# Patient Record
Sex: Male | Born: 1975 | Race: Black or African American | Hispanic: No | Marital: Married | Smoking: Never smoker
Health system: Southern US, Community
[De-identification: ages and names within clinical notes are randomized; demographics above are authoritative.]

## PROBLEM LIST (undated history)

## (undated) DIAGNOSIS — I10 Essential (primary) hypertension: Secondary | ICD-10-CM

---

## 2007-02-02 ENCOUNTER — Emergency Department: Payer: Self-pay | Admitting: Emergency Medicine

## 2008-09-21 IMAGING — CT CT ABD-PELV W/ CM
1 of 2 series · 15 of 32 positions shown, 19 images · non-contrast
Comparison: none

REASON FOR EXAM: (1) appy, hydronephrosis; (2) BLQ pain (+Mcberny's point
tend) , L CVAT, high fe
COMMENTS:

PROCEDURE:     CT  - CT ABDOMEN / PELVIS  W  - February 02, 2007 [DATE]
RESULT:     Comparison: No available comparison exam.
TECHNIQUE: CT examination of the abdomen and pelvis was performed after
intravenous administration of 100 cc of Xsovue-PWA nonionic contrast in
addition to oral contrast. Collimation is 3 mm.

[Series 2: appendicitis · axial · 0.72mm/px · z∈[-368,+34]mm · 15 of 146 slices shown, 19 images]
[im 6/146  soft-tissue]
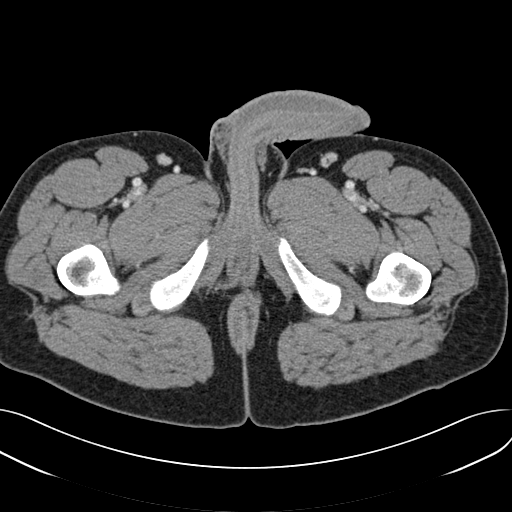
[im 6/146  bone]
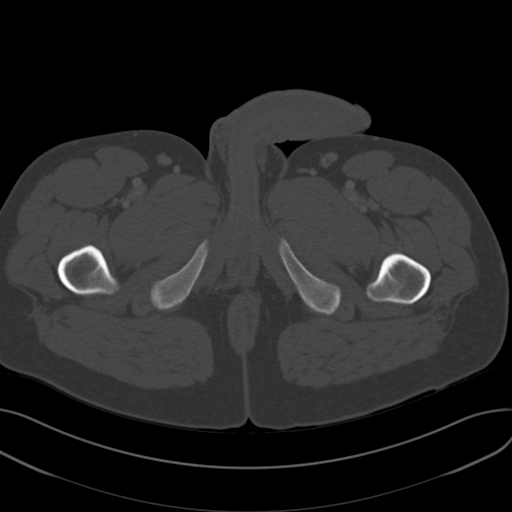
[im 17/146  soft-tissue]
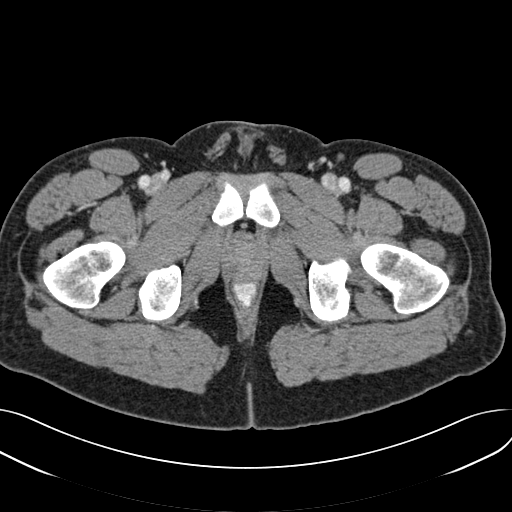
[im 28/146  soft-tissue]
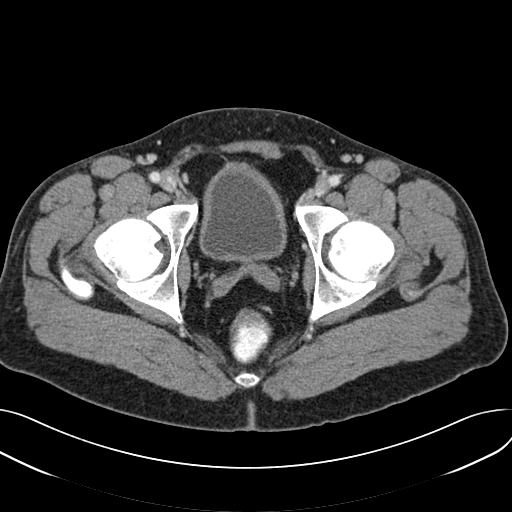
[im 40/146  soft-tissue]
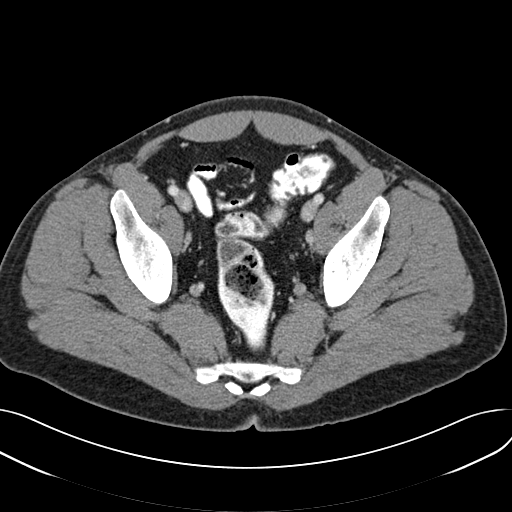
[im 51/146  soft-tissue]
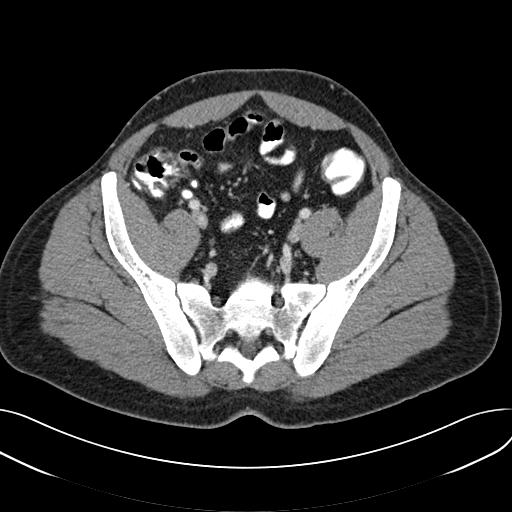
[im 62/146  soft-tissue]
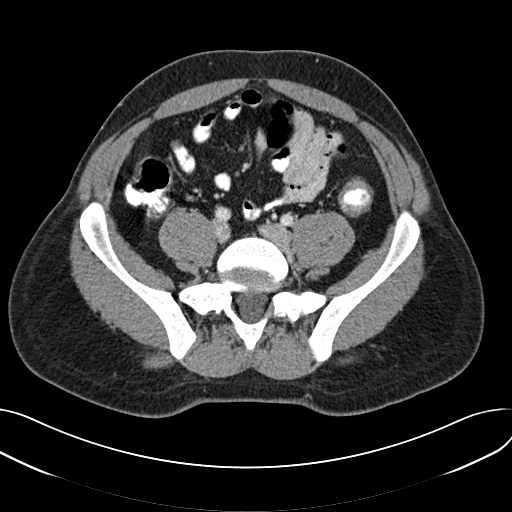
[im 73/146  soft-tissue]
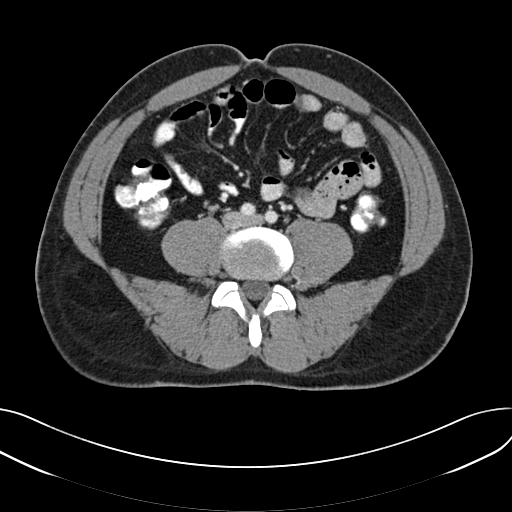
[im 84/146  soft-tissue]
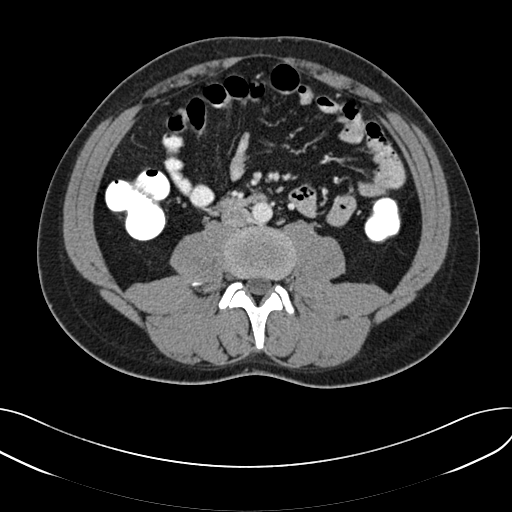
[im 95/146  soft-tissue]
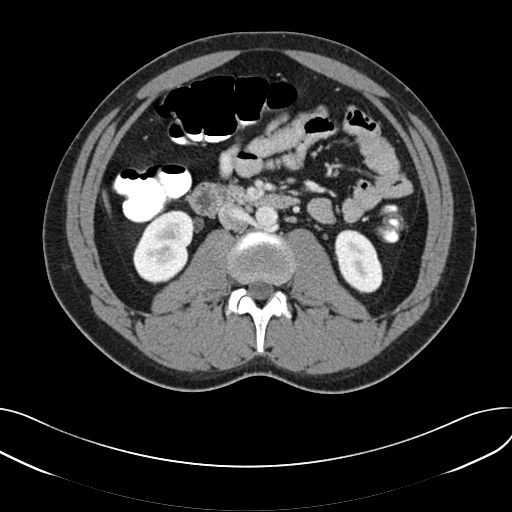
[im 95/146  bone]
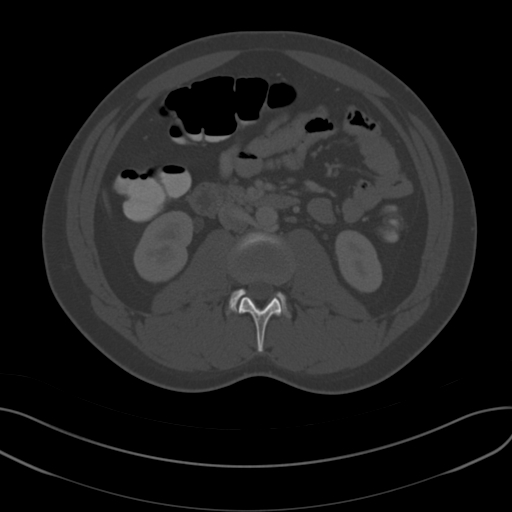
[im 106/146  soft-tissue]
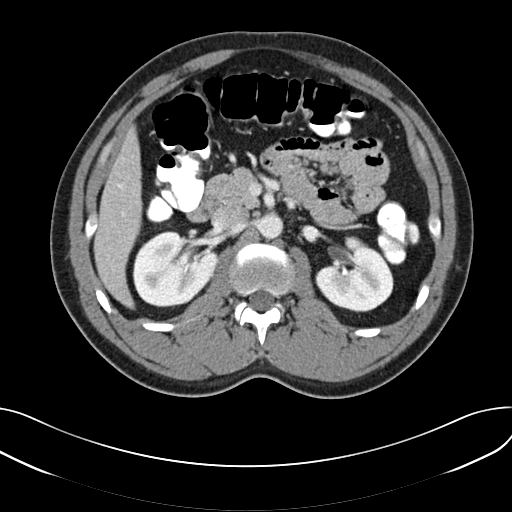
[im 118/146  soft-tissue]
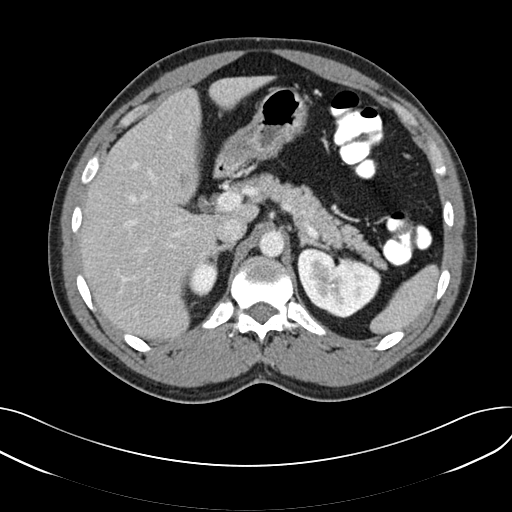
[im 123/146  lung]
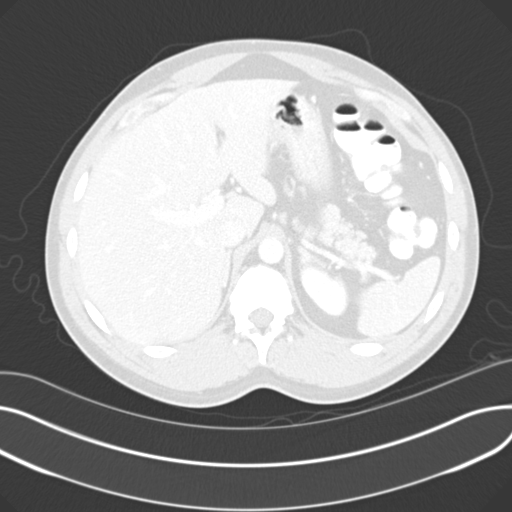
[im 129/146  soft-tissue]
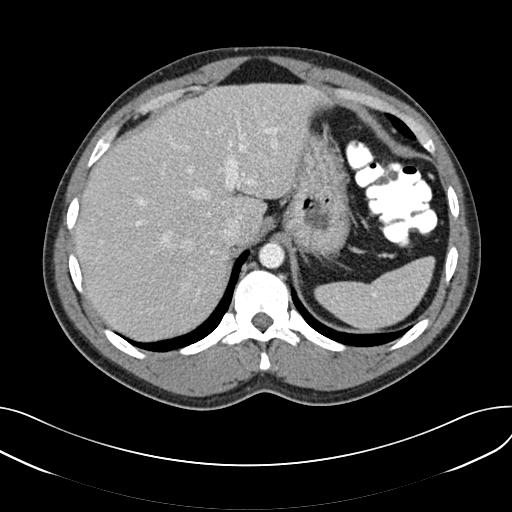
[im 129/146  lung]
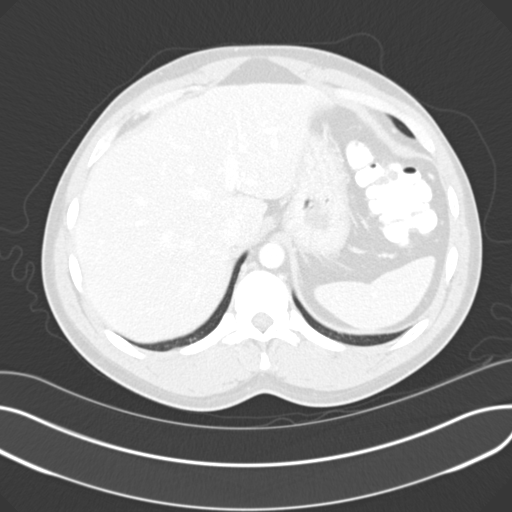
[im 134/146  lung]
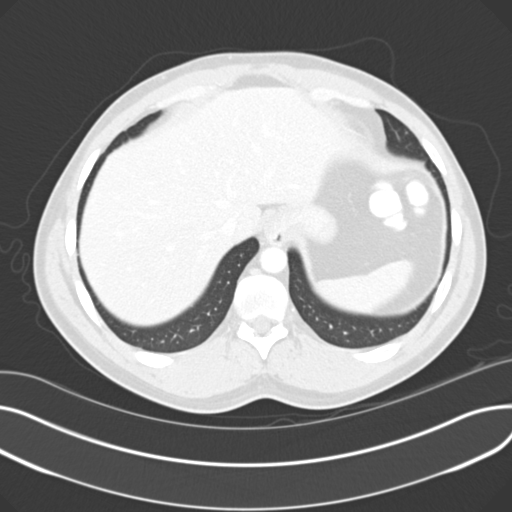
[im 140/146  soft-tissue]
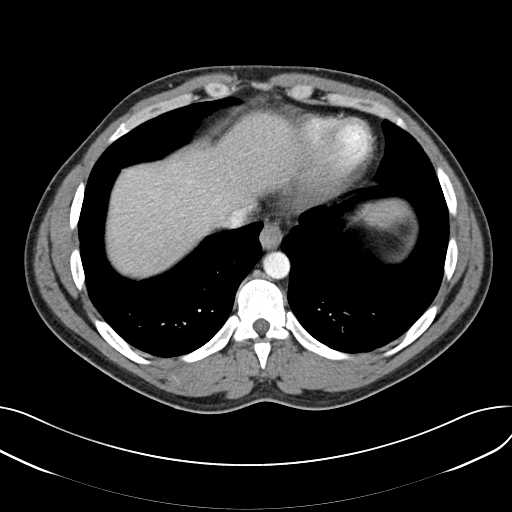
[im 140/146  lung]
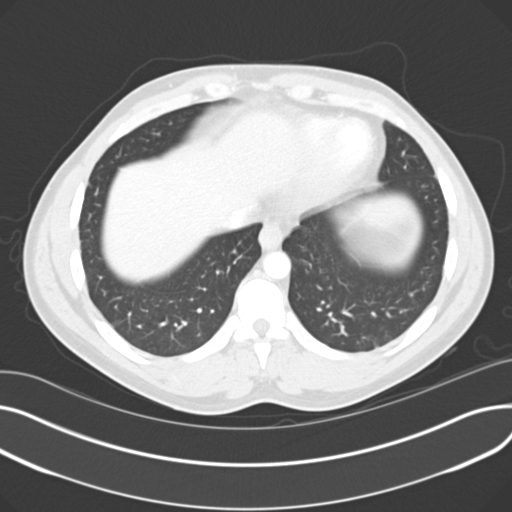

[15 of 32 positions shown; findings below may reference images not displayed]

FINDINGS: Limited evaluation of the lung bases is unremarkable

The liver, gallbladder, spleen, pancreas, adrenal glands and kidneys are
unremarkable.

There is no dilatation or definite wall thickening of the  bowels. The
appendix is unremarkable. There is no significant intra abdominal/pelvic fat
stranding. There is no intraperitoneal free air. There is no significant
free fluid. There are no enlarged abdominal pelvic lymph nodes.
IMPRESSION: 1. Small hiatal hernia.

Preliminary report was faxed to the emergency room by the night radiologist
shortly after the study was performed.

## 2019-01-23 ENCOUNTER — Other Ambulatory Visit: Payer: Self-pay

## 2019-01-23 DIAGNOSIS — Z20822 Contact with and (suspected) exposure to covid-19: Secondary | ICD-10-CM

## 2019-01-24 LAB — NOVEL CORONAVIRUS, NAA: SARS-CoV-2, NAA: NOT DETECTED

## 2019-04-10 ENCOUNTER — Other Ambulatory Visit: Payer: Self-pay

## 2019-04-10 DIAGNOSIS — Z20822 Contact with and (suspected) exposure to covid-19: Secondary | ICD-10-CM

## 2019-04-12 LAB — NOVEL CORONAVIRUS, NAA: SARS-CoV-2, NAA: NOT DETECTED

## 2019-09-04 ENCOUNTER — Ambulatory Visit: Payer: Self-pay | Attending: Internal Medicine

## 2019-09-04 ENCOUNTER — Ambulatory Visit: Payer: Self-pay

## 2019-09-04 DIAGNOSIS — Z23 Encounter for immunization: Secondary | ICD-10-CM

## 2019-09-04 NOTE — Progress Notes (Signed)
   Covid-19 Vaccination Clinic  Name:  Tyler Marshall    MRN: [id number] DOB: [birth year]  09/04/2019  Mr. Tyler Marshall was observed post Covid-19 immunization for 15 minutes without incident. He was provided with Vaccine Information Sheet and instruction to access the V-Safe system.   Mr. Tyler Marshall was instructed to call 911 with any severe reactions post vaccine: Marland Kitchen Difficulty breathing  . Swelling of face and throat  . A fast heartbeat  . A bad rash all over body  . Dizziness and weakness   Immunizations Administered    Name Date Dose VIS Date Route   Pfizer COVID-19 Vaccine 09/04/2019 10:33 AM 0.3 mL 05/14/2019 Intramuscular   Manufacturer: [employer], [employer]   Lot: [id number]   NDC: [address]-5

## 2019-09-25 ENCOUNTER — Ambulatory Visit: Payer: Self-pay | Attending: Internal Medicine

## 2019-09-25 DIAGNOSIS — Z23 Encounter for immunization: Secondary | ICD-10-CM

## 2019-09-25 NOTE — Progress Notes (Signed)
   Covid-19 Vaccination Clinic  Name:  DARYUS SOWASH    MRN: [id number] DOB: 1975-06-28  09/25/2019  Mr. Magistro was observed post Covid-19 immunization for 15 minutes without incident. He was provided with Vaccine Information Sheet and instruction to access the V-Safe system.   Mr. Bevill was instructed to call 911 with any severe reactions post vaccine: Marland Kitchen Difficulty breathing  . Swelling of face and throat  . A fast heartbeat  . A bad rash all over body  . Dizziness and weakness   Immunizations Administered    Name Date Dose VIS Date Route   Pfizer COVID-19 Vaccine 09/25/2019 11:57 AM 0.3 mL 07/28/2018 Intramuscular   Manufacturer: [employer], [employer]   Lot: [id number]   NDC: [address]-6

## 2022-05-23 ENCOUNTER — Encounter (HOSPITAL_BASED_OUTPATIENT_CLINIC_OR_DEPARTMENT_OTHER): Payer: Self-pay

## 2022-05-23 ENCOUNTER — Emergency Department (HOSPITAL_BASED_OUTPATIENT_CLINIC_OR_DEPARTMENT_OTHER): Payer: 59

## 2022-05-23 ENCOUNTER — Other Ambulatory Visit: Payer: Self-pay

## 2022-05-23 ENCOUNTER — Emergency Department (HOSPITAL_BASED_OUTPATIENT_CLINIC_OR_DEPARTMENT_OTHER)
Admission: EM | Admit: 2022-05-23 | Discharge: 2022-05-23 | Disposition: A | Discharge status: Home / Self Care | Payer: 59 | Attending: Emergency Medicine | Admitting: Emergency Medicine

## 2022-05-23 DIAGNOSIS — S0181XA Laceration without foreign body of other part of head, initial encounter: Secondary | ICD-10-CM | POA: Insufficient documentation

## 2022-05-23 DIAGNOSIS — M25552 Pain in left hip: Secondary | ICD-10-CM | POA: Diagnosis not present

## 2022-05-23 DIAGNOSIS — M542 Cervicalgia: Secondary | ICD-10-CM | POA: Diagnosis not present

## 2022-05-23 DIAGNOSIS — Y92410 Unspecified street and highway as the place of occurrence of the external cause: Secondary | ICD-10-CM | POA: Insufficient documentation

## 2022-05-23 DIAGNOSIS — M545 Low back pain, unspecified: Secondary | ICD-10-CM | POA: Diagnosis not present

## 2022-05-23 DIAGNOSIS — M50322 Other cervical disc degeneration at C5-C6 level: Secondary | ICD-10-CM | POA: Diagnosis not present

## 2022-05-23 DIAGNOSIS — Z041 Encounter for examination and observation following transport accident: Secondary | ICD-10-CM | POA: Diagnosis not present

## 2022-05-23 DIAGNOSIS — S0993XA Unspecified injury of face, initial encounter: Secondary | ICD-10-CM | POA: Diagnosis not present

## 2022-05-23 DIAGNOSIS — M79652 Pain in left thigh: Secondary | ICD-10-CM | POA: Diagnosis not present

## 2022-05-23 HISTORY — DX: Essential (primary) hypertension: I10

## 2022-05-23 MED ORDER — HYDROCODONE-ACETAMINOPHEN 5-325 MG PO TABS
1.0000 | ORAL_TABLET | Freq: Once | ORAL | Status: AC
  Administered 2022-05-23: 1 via ORAL
  Filled 2022-05-23: qty 1

## 2022-05-23 MED ORDER — METHOCARBAMOL 500 MG PO TABS: 500.0000 mg | ORAL_TABLET | Freq: Two times a day (BID) | ORAL | 0 refills | Status: AC
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
## 2022-05-23 NOTE — ED Notes (Signed)
Patient transported to CT 

## 2022-05-23 NOTE — Discharge Instructions (Addendum)
As we discussed, your workup in the ER today was reassuring for acute findings.  CT imaging and x-ray imaging did not reveal any emergent concerns.  I have given you a prescription for a muscle relaxer for you to take as prescribed as needed for management of any residual pain.  You may also take Tylenol/ibuprofen.  Please be advised that it is normal to be sore for the next 24 to 48 hours.  Additionally, given some incidental findings on your hip x-ray which I have discussed with you, I have given you a referral to sports medicine with a number to call to schedule appointment for follow-up if you continue to have discomfort in your left hip.  Return if development of any new or worsening symptoms.
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
## 2022-08-08 ENCOUNTER — Emergency Department: Payer: 59

## 2022-08-08 ENCOUNTER — Emergency Department
Admission: EM | Admit: 2022-08-08 | Discharge: 2022-08-08 | Disposition: A | Discharge status: Home / Self Care | Payer: 59 | Attending: Emergency Medicine | Admitting: Emergency Medicine

## 2022-08-08 ENCOUNTER — Other Ambulatory Visit: Payer: Self-pay

## 2022-08-08 DIAGNOSIS — I639 Cerebral infarction, unspecified: Secondary | ICD-10-CM | POA: Diagnosis not present

## 2022-08-08 DIAGNOSIS — M62838 Other muscle spasm: Secondary | ICD-10-CM | POA: Diagnosis not present

## 2022-08-08 DIAGNOSIS — F1090 Alcohol use, unspecified, uncomplicated: Secondary | ICD-10-CM | POA: Insufficient documentation

## 2022-08-08 DIAGNOSIS — R200 Anesthesia of skin: Secondary | ICD-10-CM | POA: Diagnosis not present

## 2022-08-08 DIAGNOSIS — I10 Essential (primary) hypertension: Secondary | ICD-10-CM | POA: Insufficient documentation

## 2022-08-08 DIAGNOSIS — M2578 Osteophyte, vertebrae: Secondary | ICD-10-CM | POA: Diagnosis not present

## 2022-08-08 DIAGNOSIS — M4802 Spinal stenosis, cervical region: Secondary | ICD-10-CM | POA: Diagnosis not present

## 2022-08-08 DIAGNOSIS — R29810 Facial weakness: Secondary | ICD-10-CM | POA: Diagnosis not present

## 2022-08-08 DIAGNOSIS — Z789 Other specified health status: Secondary | ICD-10-CM

## 2022-08-08 DIAGNOSIS — E876 Hypokalemia: Secondary | ICD-10-CM | POA: Diagnosis not present

## 2022-08-08 DIAGNOSIS — R252 Cramp and spasm: Secondary | ICD-10-CM | POA: Diagnosis not present

## 2022-08-08 LAB — ETHANOL: Alcohol, Ethyl (B): 173 mg/dL — ABNORMAL HIGH (ref <10)

## 2022-08-08 LAB — COMPREHENSIVE METABOLIC PANEL
ALT: 70 U/L — ABNORMAL HIGH (ref 0–44)
AST: 129 U/L — ABNORMAL HIGH (ref 15–41)
Albumin: 4.1 g/dL (ref 3.5–5.0)
Alkaline Phosphatase: 66 U/L (ref 38–126)
Anion gap: 16 — ABNORMAL HIGH (ref 5–15)
BUN: 7 mg/dL (ref 6–20)
CO2: 27 mmol/L (ref 22–32)
Calcium: 8.8 mg/dL — ABNORMAL LOW (ref 8.9–10.3)
Chloride: 95 mmol/L — ABNORMAL LOW (ref 98–111)
Creatinine, Ser: 0.72 mg/dL (ref 0.61–1.24)
GFR, Estimated: >60 mL/min (ref >60)
Glucose, Bld: 107 mg/dL — ABNORMAL HIGH (ref 70–99)
Potassium: 2.1 mmol/L — CL (ref 3.5–5.1)
Sodium: 138 mmol/L (ref 135–145)
Total Bilirubin: 0.8 mg/dL (ref 0.3–1.2)
Total Protein: 8.1 g/dL (ref 6.5–8.1)

## 2022-08-08 LAB — MAGNESIUM: Magnesium: 1.2 mg/dL — ABNORMAL LOW (ref 1.7–2.4)

## 2022-08-08 LAB — CK: Total CK: 786 U/L — ABNORMAL HIGH (ref 49–397)

## 2022-08-08 LAB — CBC
HCT: 40.2 % (ref 39.0–52.0)
Hemoglobin: 13.5 g/dL (ref 13.0–17.0)
MCH: 31.3 pg (ref 26.0–34.0)
MCHC: 33.6 g/dL (ref 30.0–36.0)
MCV: 93.3 fL (ref 80.0–100.0)
Platelets: 259 10*3/uL (ref 150–400)
RBC: 4.31 MIL/uL (ref 4.22–5.81)
RDW: 15.9 % — ABNORMAL HIGH (ref 11.5–15.5)
WBC: 7.4 10*3/uL (ref 4.0–10.5)
nRBC: 0 % (ref 0.0–0.2)

## 2022-08-08 LAB — DIFFERENTIAL
Abs Immature Granulocytes: 0.03 10*3/uL (ref 0.00–0.07)
Basophils Absolute: 0.1 10*3/uL (ref 0.0–0.1)
Basophils Relative: 1 %
Eosinophils Absolute: 0 10*3/uL (ref 0.0–0.5)
Eosinophils Relative: 0 %
Immature Granulocytes: 0 %
Lymphocytes Relative: 23 %
Lymphs Abs: 1.7 10*3/uL (ref 0.7–4.0)
Monocytes Absolute: 0.7 10*3/uL (ref 0.1–1.0)
Monocytes Relative: 9 %
Neutro Abs: 5 10*3/uL (ref 1.7–7.7)
Neutrophils Relative %: 67 %

## 2022-08-08 LAB — PROTIME-INR
INR: 1 (ref 0.8–1.2)
Prothrombin Time: 12.9 seconds (ref 11.4–15.2)

## 2022-08-08 LAB — APTT: aPTT: 26 seconds (ref 24–36)

## 2022-08-08 MED ORDER — FOLIC ACID 1 MG PO TABS: 1.0000 mg | ORAL_TABLET | Freq: Every day | ORAL | 11 refills | Status: AC

## 2022-08-08 MED ORDER — THIAMINE HCL 100 MG PO TABS: 100.0000 mg | ORAL_TABLET | Freq: Every day | ORAL | 0 refills | Status: AC

## 2022-08-08 MED ORDER — FOLIC ACID 1 MG PO TABS
1.0000 mg | ORAL_TABLET | Freq: Every day | ORAL | Status: DC
  Administered 2022-08-08: 1 mg via ORAL
  Filled 2022-08-08: qty 1

## 2022-08-08 MED ORDER — LORAZEPAM 1 MG PO TABS: 1.0000 mg | ORAL_TABLET | ORAL | Status: DC | PRN

## 2022-08-08 MED ORDER — GADOBUTROL 1 MMOL/ML IV SOLN
7.5000 mL | Freq: Once | INTRAVENOUS | Status: AC | PRN
  Administered 2022-08-08: 7.5 mL via INTRAVENOUS

## 2022-08-08 MED ORDER — ADULT MULTIVITAMIN W/MINERALS CH
1.0000 | ORAL_TABLET | Freq: Every day | ORAL | Status: DC
  Administered 2022-08-08: 1 via ORAL
  Filled 2022-08-08: qty 1

## 2022-08-08 MED ORDER — POTASSIUM CHLORIDE 10 MEQ/100ML IV SOLN
10.0000 meq | INTRAVENOUS | Status: AC
  Administered 2022-08-08 (×4): 10 meq via INTRAVENOUS
  Filled 2022-08-08 (×4): qty 100

## 2022-08-08 MED ORDER — THIAMINE MONONITRATE 100 MG PO TABS
100.0000 mg | ORAL_TABLET | Freq: Every day | ORAL | Status: DC
  Administered 2022-08-08: 100 mg via ORAL
  Filled 2022-08-08: qty 1

## 2022-08-08 MED ORDER — SODIUM CHLORIDE 0.9% FLUSH
3.0000 mL | Freq: Once | INTRAVENOUS | Status: AC
  Administered 2022-08-08: 3 mL via INTRAVENOUS

## 2022-08-08 MED ORDER — MAGNESIUM SULFATE 2 GM/50ML IV SOLN
2.0000 g | Freq: Once | INTRAVENOUS | Status: AC
  Administered 2022-08-08: 2 g via INTRAVENOUS
  Filled 2022-08-08: qty 50

## 2022-08-08 MED ORDER — SODIUM CHLORIDE 0.9 % IV BOLUS
1000.0000 mL | Freq: Once | INTRAVENOUS | Status: AC
  Administered 2022-08-08: 1000 mL via INTRAVENOUS

## 2022-08-08 MED ORDER — POTASSIUM CHLORIDE CRYS ER 20 MEQ PO TBCR
40.0000 meq | EXTENDED_RELEASE_TABLET | Freq: Once | ORAL | Status: AC
  Administered 2022-08-08: 40 meq via ORAL
  Filled 2022-08-08: qty 2

## 2022-08-08 MED ORDER — THIAMINE HCL 100 MG/ML IJ SOLN
100.0000 mg | Freq: Every day | INTRAMUSCULAR | Status: DC
  Filled 2022-08-08: qty 2

## 2022-08-08 NOTE — Discharge Instructions (Addendum)
See Resource Guide for alcohol detox programs.  See your doctor for recheck of electrolyte levels including magnesium and potassium both of which were low today and are likely the cause of your muscle spasms.  Thank you for choosing [place] for your health care today!  Please see your primary doctor this week for a follow up appointment.   Sometimes, in the early stages of certain disease courses it is difficult to detect in the emergency department evaluation -- so, it is important that you continue to monitor your symptoms and call your doctor right away or return to the emergency department if you develop any new or worsening symptoms.  Please go to the following website to schedule new (and existing) patient appointments:   [email or web address]  If you do not have a primary doctor try calling the following clinics to establish care:  If you have insurance:  [facility] [phone] [place] [address]   [facility]  [phone] [place] [place] [address]   If you do not have insurance:  [facility]  [phone] [address] [place] [address]   The following is another list of primary care offices in the area who are accepting new patients at this time.  Please reach out to one of them directly and let them know you would like to schedule an appointment to follow up on an Emergency Department visit, and/or to establish a new primary care provider (PCP).  There are likely other primary care clinics in the are who are accepting new patients, but this is an excellent place to start:  [place] physician: Dr Lavon Paganini [address] [place], [place] [address] [phone]  [facility] Lead Physician: Dr Steele Sizer [address], [place], [place] [address] [phone]  [place] Physician:  Dr Park Liter [address] [place], [place] [address] [phone]  [facility] Lead Physician: Dr Dewaine Oats [place], [place], [place] [address] [phone]  [place] [facility] [place] Physician: Dr Halina Maidens [address] [place], [place] [address] [phone]   It was my pleasure to care for you today.   Hoover Brunette Jacelyn Grip, MD

## 2022-08-08 NOTE — ED Provider Notes (Signed)
[facility] Provider Note    Event Date/Time   First MD Initiated Contact with Patient 08/08/22 1128     (approximate)   History   Numbness   HPI  Tyler Marshall is a 47 y.o. male   Past medical history of hypertension who presents to the emergency department with muscle cramping on the right side as well as facial numbness.  First occurrence of right-sided arm cramping happened last week which resolved and had a normal week went to bed last night without symptoms and awoke this morning with facial numbness on the right side as well as cramping pain to the right arm and muscle spasms.  He had no preceding trauma but does note a GI illness 2 weeks ago with diarrhea that has resolved.  He tried to drive to work in [place] today but felt profound fatigue and lightheadedness so he pulled over and took a nap in his car which is very unusual for him.  His last known normal was around 9 PM last night when he went to bed.   External Medical Documents Reviewed: Emergency department note dated December 2023 with an [facility]      Physical Exam   Triage Vital Signs: ED Triage Vitals  Enc Vitals Group     BP 08/08/22 1119 (!) 155/119     Pulse Rate 08/08/22 1119 (!) 103     Resp 08/08/22 1119 18     Temp 08/08/22 1119 97.7 F (36.5 C)     Temp Source 08/08/22 1119 Oral     SpO2 08/08/22 1119 95 %     Weight --      Height --      Head Circumference --      Peak Flow --      Pain Score 08/08/22 1116 0     Pain Loc --      Pain Edu? --      Excl. in [place]? --     Most recent vital signs: Vitals:   08/08/22 1119  BP: (!) 155/119  Pulse: (!) 103  Resp: 18  Temp: 97.7 F (36.5 C)  SpO2: 95%    General: Awake, no distress.  CV:  Good peripheral perfusion.  Resp:  Normal effort.  Abd:  No distention.  Other:  Awake alert cooperative pleasant conversant answering all questions appropriately.  Hypertension [id number] systolic and tachycardic [id number], he does  have unilateral spasticity and rigidity to the arm and leg on the right side neurovascular intact no signs of trauma or tenderness.  No sensory deficit no obvious facial asymmetry or dysarthria finger-nose is normal   ED Results / Procedures / Treatments   Labs (all labs ordered are listed, but only abnormal results are displayed) Labs Reviewed  CBC - Abnormal; Notable for the following components:      Result Value   RDW 15.9 (*)    All other components within normal limits  COMPREHENSIVE METABOLIC PANEL - Abnormal; Notable for the following components:   Potassium 2.1 (*)    Chloride 95 (*)    Glucose, Bld 107 (*)    Calcium 8.8 (*)    AST 129 (*)    ALT 70 (*)    Anion gap 16 (*)    All other components within normal limits  ETHANOL - Abnormal; Notable for the following components:   Alcohol, Ethyl (B) 173 (*)    All other components within normal limits  CK - Abnormal; Notable for  the following components:   Total CK 786 (*)    All other components within normal limits  MAGNESIUM - Abnormal; Notable for the following components:   Magnesium 1.2 (*)    All other components within normal limits  PROTIME-INR  APTT  DIFFERENTIAL  CBG MONITORING, ED  I-STAT CREATININE, ED     I ordered and reviewed the above labs they are notable for marked hypokalemia and hypomagnesemia slightly + CK and ethanol 170s  EKG  ED ECG REPORT I, Lucillie Garfinkel, the attending physician, personally viewed and interpreted this ECG.   Date: 08/08/2022  EKG Time: 1124  Rate: 101  Rhythm: sinus tachycardia  Axis: nl  Intervals:none  ST&T Change: no acute ischemic changes     RADIOLOGY I independently reviewed and interpreted CT of the head see no obvious bleeding or midline shift   PROCEDURES:  Critical Care performed: No  Procedures   MEDICATIONS ORDERED IN ED: Medications  potassium chloride 10 mEq in 100 mL IVPB (10 mEq Intravenous New Bag/Given 08/08/22 1520)  LORazepam (ATIVAN)  tablet 1-4 mg (has no administration in time range)  thiamine (VITAMIN B1) tablet 100 mg (100 mg Oral Given 08/08/22 1258)    Or  thiamine (VITAMIN B1) injection 100 mg ( Intravenous See Alternative [id number])  folic acid (FOLVITE) tablet 1 mg (1 mg Oral Given 08/08/22 1258)  multivitamin with minerals tablet 1 tablet (1 tablet Oral Given 08/08/22 1258)  sodium chloride flush (NS) 0.9 % injection 3 mL (3 mLs Intravenous Given 08/08/22 1146)  sodium chloride 0.9 % bolus 1,000 mL (0 mLs Intravenous Stopped 08/08/22 1356)  potassium chloride SA (KLOR-CON M) CR tablet 40 mEq (40 mEq Oral Given 08/08/22 1246)  magnesium sulfate IVPB 2 g 50 mL (0 g Intravenous Stopped 08/08/22 1356)  gadobutrol (GADAVIST) 1 MMOL/ML injection 7.5 mL (7.5 mLs Intravenous Contrast Given 08/08/22 1453)    External physician / consultants:  I spoke with neurology Dr.Bahgat Regarding care plan for this patient.   IMPRESSION / MDM / ASSESSMENT AND PLAN / ED COURSE  I reviewed the triage vital signs and the nursing notes.                                Patient's presentation is most consistent with acute presentation with potential threat to life or bodily function.  Differential diagnosis includes, but is not limited to, CVA, inflammatory neurologic process, ALS, head bleeding, electrolyte abnormality/dehydration, PVD, drug reaction   The patient is on the cardiac monitor to evaluate for evidence of arrhythmia and/or significant heart rate changes.  MDM:  outside of window and unusual sx for CVA; get head CT and stroke labs. Will need admission for severe symptoms unilateral considered but less likely dehydration/electyolytes to cause unilateral sx only. ALS, inflammatory disease possible. No resp sx.   Neurology recommended MRI with and without contrast of the head and C-spine.  Patient with marked hypokalemia and hypomagnesemia as well as an ethanol level in the 170s and liver enzymes consistent with alcoholic liver.  He  admits to drinking 1 pint of liquor daily.  I think that his symptoms are most likely in the setting of these electrolyte derangements, though curiously only on one side of the body.  They have now resolved with repletion.  Pending MRI results if negative plan will be for discharge home with detox resources given to patient, follow-up with PMD for recheck electrolytes.  FINAL CLINICAL IMPRESSION(S) / ED DIAGNOSES   Final diagnoses:  Alcohol use  Hypokalemia  Hypomagnesemia  Muscle spasm     Rx / DC Orders   ED Discharge Orders          Ordered    folic acid (FOLVITE) 1 MG tablet  Daily        08/08/22 1530    thiamine (VITAMIN B1) 100 MG tablet  Daily        08/08/22 1530             Note:  This document was prepared using Dragon voice recognition software and may include unintentional dictation errors.    Lucillie Garfinkel, MD 08/08/22 [phone]

## 2022-08-08 NOTE — ED Triage Notes (Addendum)
Pt reports last night went to bed around 9pm and woke up this morning with right sided face numbness, dizziness and nausea. Pt reports he was driving to work and lost feeling on the whole right side of his body (face, arm and leg) around 7:25am. Pt denies blood thinners. Pt denies hx CVA. Pt reports hx Htn. Pt also states his hand and fingers turned pale. Pt reports some sensation has come back. Pt reports not compliant with BP meds.

## 2022-12-09 ENCOUNTER — Emergency Department (HOSPITAL_COMMUNITY)
Admission: EM | Admit: 2022-12-09 | Discharge: 2022-12-09 | Disposition: A | Discharge status: Home / Self Care | Payer: 59 | Attending: Emergency Medicine | Admitting: Emergency Medicine

## 2022-12-09 ENCOUNTER — Other Ambulatory Visit: Payer: Self-pay

## 2022-12-09 DIAGNOSIS — R110 Nausea: Secondary | ICD-10-CM | POA: Diagnosis not present

## 2022-12-09 DIAGNOSIS — F10230 Alcohol dependence with withdrawal, uncomplicated: Secondary | ICD-10-CM | POA: Diagnosis present

## 2022-12-09 DIAGNOSIS — E876 Hypokalemia: Secondary | ICD-10-CM

## 2022-12-09 DIAGNOSIS — I10 Essential (primary) hypertension: Secondary | ICD-10-CM | POA: Diagnosis not present

## 2022-12-09 DIAGNOSIS — R519 Headache, unspecified: Secondary | ICD-10-CM | POA: Diagnosis not present

## 2022-12-09 DIAGNOSIS — R112 Nausea with vomiting, unspecified: Secondary | ICD-10-CM | POA: Diagnosis not present

## 2022-12-09 DIAGNOSIS — F10930 Alcohol use, unspecified with withdrawal, uncomplicated: Secondary | ICD-10-CM

## 2022-12-09 DIAGNOSIS — R1111 Vomiting without nausea: Secondary | ICD-10-CM | POA: Diagnosis not present

## 2022-12-09 DIAGNOSIS — Z743 Need for continuous supervision: Secondary | ICD-10-CM | POA: Diagnosis not present

## 2022-12-09 LAB — CBC WITH DIFFERENTIAL/PLATELET
Abs Immature Granulocytes: 0.03 10*3/uL (ref 0.00–0.07)
Basophils Absolute: 0.1 10*3/uL (ref 0.0–0.1)
Basophils Relative: 1 %
Eosinophils Absolute: 0 10*3/uL (ref 0.0–0.5)
Eosinophils Relative: 0 %
HCT: 42.2 % (ref 39.0–52.0)
Hemoglobin: 14.4 g/dL (ref 13.0–17.0)
Immature Granulocytes: 0 %
Lymphocytes Relative: 11 %
Lymphs Abs: 0.9 10*3/uL (ref 0.7–4.0)
MCH: 31.4 pg (ref 26.0–34.0)
MCHC: 34.1 g/dL (ref 30.0–36.0)
MCV: 91.9 fL (ref 80.0–100.0)
Monocytes Absolute: 0.4 10*3/uL (ref 0.1–1.0)
Monocytes Relative: 5 %
Neutro Abs: 6.9 10*3/uL (ref 1.7–7.7)
Neutrophils Relative %: 83 %
Platelets: 281 10*3/uL (ref 150–400)
RBC: 4.59 MIL/uL (ref 4.22–5.81)
RDW: 16.2 % — ABNORMAL HIGH (ref 11.5–15.5)
WBC: 8.3 10*3/uL (ref 4.0–10.5)
nRBC: 0 % (ref 0.0–0.2)

## 2022-12-09 LAB — RAPID URINE DRUG SCREEN, HOSP PERFORMED
Amphetamines: NOT DETECTED
Barbiturates: NOT DETECTED
Benzodiazepines: NOT DETECTED
Cocaine: POSITIVE — AB
Opiates: NOT DETECTED
Tetrahydrocannabinol: NOT DETECTED

## 2022-12-09 LAB — COMPREHENSIVE METABOLIC PANEL
ALT: 58 U/L — ABNORMAL HIGH (ref 0–44)
AST: 92 U/L — ABNORMAL HIGH (ref 15–41)
Albumin: 4.4 g/dL (ref 3.5–5.0)
Alkaline Phosphatase: 67 U/L (ref 38–126)
Anion gap: 21 — ABNORMAL HIGH (ref 5–15)
BUN: 8 mg/dL (ref 6–20)
CO2: 25 mmol/L (ref 22–32)
Calcium: 8.5 mg/dL — ABNORMAL LOW (ref 8.9–10.3)
Chloride: 91 mmol/L — ABNORMAL LOW (ref 98–111)
Creatinine, Ser: 0.77 mg/dL (ref 0.61–1.24)
GFR, Estimated: >60 mL/min (ref >60)
Glucose, Bld: 77 mg/dL (ref 70–99)
Potassium: 2.8 mmol/L — ABNORMAL LOW (ref 3.5–5.1)
Sodium: 137 mmol/L (ref 135–145)
Total Bilirubin: 2.2 mg/dL — ABNORMAL HIGH (ref 0.3–1.2)
Total Protein: 9.4 g/dL — ABNORMAL HIGH (ref 6.5–8.1)

## 2022-12-09 LAB — ETHANOL: Alcohol, Ethyl (B): <10 mg/dL (ref <10)

## 2022-12-09 LAB — MAGNESIUM: Magnesium: 1.2 mg/dL — ABNORMAL LOW (ref 1.7–2.4)

## 2022-12-09 MED ORDER — POTASSIUM CHLORIDE CRYS ER 20 MEQ PO TBCR
40.0000 meq | EXTENDED_RELEASE_TABLET | Freq: Once | ORAL | Status: AC
  Administered 2022-12-09: 40 meq via ORAL
  Filled 2022-12-09: qty 2

## 2022-12-09 MED ORDER — ONDANSETRON HCL 4 MG/2ML IJ SOLN
4.0000 mg | Freq: Once | INTRAMUSCULAR | Status: AC
  Administered 2022-12-09: 4 mg via INTRAVENOUS
  Filled 2022-12-09: qty 2

## 2022-12-09 MED ORDER — CHLORDIAZEPOXIDE HCL 25 MG PO CAPS
50.0000 mg | ORAL_CAPSULE | Freq: Once | ORAL | Status: AC
  Administered 2022-12-09: 50 mg via ORAL
  Filled 2022-12-09: qty 2

## 2022-12-09 MED ORDER — POTASSIUM CHLORIDE CRYS ER 20 MEQ PO TBCR: 40.0000 meq | EXTENDED_RELEASE_TABLET | Freq: Two times a day (BID) | ORAL | 0 refills | Status: AC

## 2022-12-09 MED ORDER — LACTATED RINGERS IV BOLUS
1000.0000 mL | Freq: Once | INTRAVENOUS | Status: AC
  Administered 2022-12-09: 1000 mL via INTRAVENOUS

## 2022-12-09 MED ORDER — MAGNESIUM SULFATE 2 GM/50ML IV SOLN
2.0000 g | Freq: Once | INTRAVENOUS | Status: AC
  Administered 2022-12-09: 2 g via INTRAVENOUS
  Filled 2022-12-09: qty 50

## 2022-12-09 MED ORDER — THIAMINE HCL 100 MG PO TABS: 100.0000 mg | ORAL_TABLET | Freq: Every day | ORAL | 1 refills | Status: AC

## 2022-12-09 MED ORDER — CHLORDIAZEPOXIDE HCL 25 MG PO CAPS: ORAL_CAPSULE | ORAL | 0 refills | Status: AC

## 2022-12-09 MED ORDER — DIAZEPAM 5 MG/ML IJ SOLN
10.0000 mg | Freq: Once | INTRAMUSCULAR | Status: AC
  Administered 2022-12-09: 10 mg via INTRAVENOUS
  Filled 2022-12-09: qty 2

## 2022-12-09 MED ORDER — MAGNESIUM OXIDE 400 MG PO CAPS: 400.0000 mg | ORAL_CAPSULE | Freq: Every day | ORAL | 0 refills | Status: AC

## 2022-12-09 MED ORDER — IBUPROFEN 200 MG PO TABS
600.0000 mg | ORAL_TABLET | Freq: Once | ORAL | Status: AC
  Administered 2022-12-09: 600 mg via ORAL
  Filled 2022-12-09: qty 3

## 2022-12-09 NOTE — ED Triage Notes (Signed)
Pt BIBA from work c/o N/V, ETOH w/d, tremors. No seizure-like activity. Complains of headache.   BP 177/117 HR 98 CBG 81

## 2022-12-09 NOTE — Discharge Instructions (Signed)
You are being given a medicine called Librium which will help prevent alcohol withdrawal.  However it is very dangerous to also drink alcohol while on this medicine.  This will help prevent withdrawals but you still need to follow-up with a detoxification center to help detox from alcohol.  Your potassium and magnesium are also low today and you will need to take oral replacements for these.  If you develop new or worsening symptoms of withdrawal, severe headache, confusion, vomiting, or any other new/concerning symptoms then return to the ER or call 911.

## 2022-12-09 NOTE — ED Provider Notes (Signed)
[facility] EMERGENCY DEPARTMENT AT [facility] Provider Note   CSN: [id number] Arrival date & time: 12/09/22  1100     History  Chief Complaint  Patient presents with   Nausea   Emesis   Withdrawal    Tyler Marshall is a 47 y.o. male.  HPI 47 year old male presents with concern for alcohol withdrawal.  He drinks daily, usually a couple seltzers and a pint of liquor.  He last drank yesterday morning but states he only had 1 beer and did not have any liquor because the store was closed.  He has not had anything today.  He has been dealing with on and off vomiting for quite some time (years) and he vomited a couple times this morning and then in the car on the way to work he started to develop diaphoresis.  He has developed shaking in his arms.  All the symptoms are consistent with prior alcohol withdrawal.  No seizure-like activity.  He states he has a mild headache but no other weakness or numbness or neurosymptoms.  He denies suicidal thoughts or depression.  No current chest pain. He wants to stop drinking.  Home Medications Prior to Admission medications   Medication Sig Start Date End Date Taking? Authorizing Provider  chlordiazePOXIDE (LIBRIUM) 25 MG capsule 50mg  PO TID x 1D, then 25-50mg  PO BID X 1D, then 25-50mg  PO QD X 1D 12/09/22  Yes Pricilla Loveless, MD  Magnesium Oxide 400 MG CAPS Take 1 capsule (400 mg total) by mouth daily for 10 days. 12/09/22 12/19/22 Yes Pricilla Loveless, MD  potassium chloride SA (KLOR-CON M) 20 MEQ tablet Take 2 tablets (40 mEq total) by mouth 2 (two) times daily for 5 days. 12/09/22 12/14/22 Yes Pricilla Loveless, MD  thiamine (VITAMIN B1) 100 MG tablet Take 1 tablet (100 mg total) by mouth daily. 12/09/22  Yes Pricilla Loveless, MD  acetaminophen-codeine (TYLENOL #2) 300-15 MG tablet Take 1 tablet by mouth 4 (four) times daily as needed. 06/25/22   [provider]  folic acid (FOLVITE) 1 MG tablet Take 1 tablet (1 mg total) by mouth daily.  08/08/22 08/08/23  Pilar Jarvis, MD  methocarbamol (ROBAXIN) 500 MG tablet Take 1 tablet (500 mg total) by mouth 2 (two) times daily. 05/23/22   Smoot, Shawn Route, PA-C  omeprazole (PRILOSEC) 40 MG capsule Take 40 mg by mouth daily. 07/31/22   [provider]  sucralfate (CARAFATE) 1 g tablet Take 1 g by mouth every 6 (six) hours. Patient not taking: Reported on 08/08/2022 04/02/22   [provider]      Allergies    Penicillins    Review of Systems   Review of Systems  Gastrointestinal:  Positive for nausea and vomiting. Negative for abdominal pain (chronic, none right now).  Neurological:  Positive for tremors and headaches. Negative for weakness and numbness.    Physical Exam Updated Vital Signs BP (!) 163/109 (BP Location: Left Arm)   Pulse 95   Temp 98.3 F (36.8 C) (Oral)   Resp 20   Ht 5\' 9"  (1.753 m)   Wt 81.2 kg   SpO2 96%   BMI 26.43 kg/m  Physical Exam Vitals and nursing note reviewed.  Constitutional:      General: He is not in acute distress.    Appearance: He is well-developed. He is not ill-appearing or diaphoretic.  HENT:     Head: Normocephalic and atraumatic.  Eyes:     Extraocular Movements: Extraocular movements intact.  Pupils: Pupils are equal, round, and reactive to light.  Cardiovascular:     Rate and Rhythm: Regular rhythm. Tachycardia present.     Heart sounds: Normal heart sounds.     Comments: HR~100 Pulmonary:     Effort: Pulmonary effort is normal.     Breath sounds: Normal breath sounds.  Abdominal:     Palpations: Abdomen is soft.     Tenderness: There is no abdominal tenderness.  Skin:    General: Skin is warm and dry.  Neurological:     Mental Status: He is alert and oriented to person, place, and time.     Comments: CN 3-12 grossly intact. 5/5 strength in all 4 extremities. Grossly normal sensation. Normal finger to nose. Upper extremity bilateral tremor  Psychiatric:        Mood and Affect: Mood is not depressed.         Thought Content: Thought content does not include suicidal ideation.     ED Results / Procedures / Treatments   Labs (all labs ordered are listed, but only abnormal results are displayed) Labs Reviewed  MAGNESIUM - Abnormal; Notable for the following components:      Result Value   Magnesium 1.2 (*)    All other components within normal limits  RAPID URINE DRUG SCREEN, HOSP PERFORMED - Abnormal; Notable for the following components:   Cocaine POSITIVE (*)    All other components within normal limits  COMPREHENSIVE METABOLIC PANEL - Abnormal; Notable for the following components:   Potassium 2.8 (*)    Chloride 91 (*)    Calcium 8.5 (*)    Total Protein 9.4 (*)    AST 92 (*)    ALT 58 (*)    Total Bilirubin 2.2 (*)    Anion gap 21 (*)    All other components within normal limits  CBC WITH DIFFERENTIAL/PLATELET - Abnormal; Notable for the following components:   RDW 16.2 (*)    All other components within normal limits  ETHANOL    EKG EKG Interpretation Date/Time:  Monday December 09 2022 12:06:25 EDT Ventricular Rate:  96 PR Interval:  148 QRS Duration:  94 QT Interval:  376 QTC Calculation: 476 R Axis:   22  Text Interpretation: Sinus rhythm Probable left atrial enlargement T wave changes similar to March 2024 tracing Confirmed by Alona Bene [phone]) on 12/09/2022 12:18:14 PM  Radiology No results found.  Procedures Procedures    Medications Ordered in ED Medications  diazepam (VALIUM) injection 10 mg (10 mg Intravenous Given 12/09/22 1221)  lactated ringers bolus 1,000 mL (0 mLs Intravenous Stopped 12/09/22 1339)  ondansetron (ZOFRAN) injection 4 mg (4 mg Intravenous Given 12/09/22 1220)  magnesium sulfate IVPB 2 g 50 mL (2 g Intravenous New Bag/Given 12/09/22 1338)  potassium chloride SA (KLOR-CON M) CR tablet 40 mEq (40 mEq Oral Given 12/09/22 1339)  chlordiazePOXIDE (LIBRIUM) capsule 50 mg (50 mg Oral Given 12/09/22 1339)  ibuprofen (ADVIL) tablet 600 mg (600 mg Oral  Given 12/09/22 1339)    ED Course/ Medical Decision Making/ A&P                             Medical Decision Making Amount and/or Complexity of Data Reviewed External Data Reviewed: notes. Labs: ordered.    Details: Potassium 2.8, magnesium 1.2.  Cocaine and UDS ECG/medicine tests: ordered and independent interpretation performed.    Details: Nonspecific but unchanged T waves.  Risk OTC  drugs. Prescription drug management.   Patient presents with alcohol withdrawal.  Right now his CIWA is about a 3.  He did well with some Valium.  He is hypertensive though that seems to be a recurrent issue for him and chronic.  He has a mild headache with went away with some ibuprofen.  No neurodeficits or altered mental status.  I think is withdrawal is relatively mild and I think amenable to outpatient treatment.  Patient is in agreement.  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  He supposed be on meds but has not been on them for a while.  Will replete these both here as well as an outpatient and will have him take a Librium taper and follow-up with detox.  Otherwise, will give return precautions.        Final Clinical Impression(s) / ED Diagnoses Final diagnoses:  Alcohol withdrawal syndrome without complication (HCC)  Hypokalemia  Hypomagnesemia    Rx / DC Orders ED Discharge Orders          Ordered    chlordiazePOXIDE (LIBRIUM) 25 MG capsule        12/09/22 1453    Magnesium Oxide 400 MG CAPS  Daily        12/09/22 1453    potassium chloride SA (KLOR-CON M) 20 MEQ tablet  2 times daily        12/09/22 1453    thiamine (VITAMIN B1) 100 MG tablet  Daily        12/09/22 1453              Pricilla Loveless, MD 12/09/22 1519

## 2022-12-24 ENCOUNTER — Inpatient Hospital Stay: Payer: Self-pay | Admitting: Nurse Practitioner

## 2023-01-30 ENCOUNTER — Ambulatory Visit: Payer: Self-pay | Admitting: Physician Assistant

## 2024-06-14 ENCOUNTER — Emergency Department

## 2024-06-14 ENCOUNTER — Emergency Department
Admission: EM | Admit: 2024-06-14 | Discharge: 2024-06-15 | Disposition: A | Attending: Emergency Medicine | Admitting: Emergency Medicine

## 2024-06-14 DIAGNOSIS — R55 Syncope and collapse: Secondary | ICD-10-CM | POA: Insufficient documentation

## 2024-06-14 DIAGNOSIS — Y92410 Unspecified street and highway as the place of occurrence of the external cause: Secondary | ICD-10-CM | POA: Insufficient documentation

## 2024-06-14 DIAGNOSIS — S060X9A Concussion with loss of consciousness of unspecified duration, initial encounter: Secondary | ICD-10-CM | POA: Diagnosis not present

## 2024-06-14 DIAGNOSIS — E876 Hypokalemia: Secondary | ICD-10-CM | POA: Insufficient documentation

## 2024-06-14 DIAGNOSIS — S0990XA Unspecified injury of head, initial encounter: Secondary | ICD-10-CM | POA: Diagnosis present

## 2024-06-14 LAB — CBC WITH DIFFERENTIAL/PLATELET
Abs Immature Granulocytes: 0.01 K/uL (ref 0.00–0.07)
Basophils Absolute: 0.1 K/uL (ref 0.0–0.1)
Basophils Relative: 1 %
Eosinophils Absolute: 0.1 K/uL (ref 0.0–0.5)
Eosinophils Relative: 2 %
HCT: 35.4 % — ABNORMAL LOW (ref 39.0–52.0)
Hemoglobin: 12.5 g/dL — ABNORMAL LOW (ref 13.0–17.0)
Immature Granulocytes: 0 %
Lymphocytes Relative: 41 %
Lymphs Abs: 2.1 K/uL (ref 0.7–4.0)
MCH: 30.8 pg (ref 26.0–34.0)
MCHC: 35.3 g/dL (ref 30.0–36.0)
MCV: 87.2 fL (ref 80.0–100.0)
Monocytes Absolute: 1 K/uL (ref 0.1–1.0)
Monocytes Relative: 20 %
Neutro Abs: 1.8 K/uL (ref 1.7–7.7)
Neutrophils Relative %: 36 %
Platelets: 183 K/uL (ref 150–400)
RBC: 4.06 MIL/uL — ABNORMAL LOW (ref 4.22–5.81)
RDW: 16.5 % — ABNORMAL HIGH (ref 11.5–15.5)
WBC: 5.1 K/uL (ref 4.0–10.5)
nRBC: 0 % (ref 0.0–0.2)

## 2024-06-14 LAB — MAGNESIUM: Magnesium: 1.6 mg/dL — ABNORMAL LOW (ref 1.7–2.4)

## 2024-06-14 LAB — COMPREHENSIVE METABOLIC PANEL WITH GFR
ALT: 93 U/L — ABNORMAL HIGH (ref 0–44)
AST: 129 U/L — ABNORMAL HIGH (ref 15–41)
Albumin: 4.7 g/dL (ref 3.5–5.0)
Alkaline Phosphatase: 91 U/L (ref 38–126)
Anion gap: 17 — ABNORMAL HIGH (ref 5–15)
BUN: 14 mg/dL (ref 6–20)
CO2: 28 mmol/L (ref 22–32)
Calcium: 9.4 mg/dL (ref 8.9–10.3)
Chloride: 93 mmol/L — ABNORMAL LOW (ref 98–111)
Creatinine, Ser: 0.74 mg/dL (ref 0.61–1.24)
GFR, Estimated: >60 mL/min
Glucose, Bld: 94 mg/dL (ref 70–99)
Potassium: 2.4 mmol/L — CL (ref 3.5–5.1)
Sodium: 138 mmol/L (ref 135–145)
Total Bilirubin: 0.8 mg/dL (ref 0.0–1.2)
Total Protein: 8.1 g/dL (ref 6.5–8.1)

## 2024-06-14 MED ORDER — POTASSIUM CHLORIDE 20 MEQ PO PACK
40.0000 meq | PACK | Freq: Once | ORAL | Status: AC
  Administered 2024-06-15: 40 meq via ORAL
  Filled 2024-06-14: qty 2

## 2024-06-14 MED ORDER — SODIUM CHLORIDE 0.9 % IV BOLUS
1000.0000 mL | Freq: Once | INTRAVENOUS | Status: AC
  Administered 2024-06-14: 1000 mL via INTRAVENOUS

## 2024-06-14 MED ORDER — POTASSIUM CHLORIDE 20 MEQ PO PACK: 40.0000 meq | PACK | Freq: Every day | ORAL | Status: DC

## 2024-06-14 MED ORDER — LIDOCAINE 5 % EX PTCH
1.0000 | MEDICATED_PATCH | CUTANEOUS | Status: DC
  Administered 2024-06-14: 1 via TRANSDERMAL
  Filled 2024-06-14: qty 1

## 2024-06-14 MED ORDER — IOHEXOL 300 MG/ML  SOLN
100.0000 mL | Freq: Once | INTRAMUSCULAR | Status: AC | PRN
  Administered 2024-06-14: 100 mL via INTRAVENOUS

## 2024-06-14 MED ORDER — POTASSIUM CHLORIDE 20 MEQ PO PACK
40.0000 meq | PACK | Freq: Every day | ORAL | Status: DC
  Administered 2024-06-14: 40 meq via ORAL
  Filled 2024-06-14: qty 2

## 2024-06-14 MED ORDER — POTASSIUM CHLORIDE 10 MEQ/100ML IV SOLN
10.0000 meq | INTRAVENOUS | Status: AC
  Administered 2024-06-15: 10 meq via INTRAVENOUS
  Filled 2024-06-14 (×2): qty 100

## 2024-06-14 MED ORDER — POTASSIUM CHLORIDE 10 MEQ/100ML IV SOLN
10.0000 meq | INTRAVENOUS | Status: AC
  Administered 2024-06-14 (×2): 10 meq via INTRAVENOUS
  Filled 2024-06-14 (×2): qty 100

## 2024-06-14 MED ORDER — KETOROLAC TROMETHAMINE 15 MG/ML IJ SOLN
15.0000 mg | Freq: Once | INTRAMUSCULAR | Status: AC
  Administered 2024-06-14: 15 mg via INTRAVENOUS
  Filled 2024-06-14: qty 1

## 2024-06-14 MED ORDER — POTASSIUM CHLORIDE 20 MEQ PO PACK: 40.0000 meq | PACK | Freq: Every day | ORAL | 0 refills | Status: AC

## 2024-06-14 MED ORDER — ACETAMINOPHEN 500 MG PO TABS
1000.0000 mg | ORAL_TABLET | Freq: Once | ORAL | Status: AC
  Administered 2024-06-14: 1000 mg via ORAL
  Filled 2024-06-14: qty 2

## 2024-06-14 MED ORDER — MAGNESIUM SULFATE 2 GM/50ML IV SOLN
2.0000 g | Freq: Once | INTRAVENOUS | Status: AC
  Administered 2024-06-14: 2 g via INTRAVENOUS
  Filled 2024-06-14: qty 50
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
## 2024-06-14 NOTE — Discharge Instructions (Addendum)
 We discussed admission to the hospital for IV potassium but given you had no symptoms and this is a chronic issue for you have opted to prefer to go home as we had treated you for it here in the emergency room.  Take potassium pills to help increase your potassium and return to the ER if you develop worsening symptoms, fevers, weakness or any other concerns  Call PCP to do recheck potassium in 2 days.

## 2024-06-15 MED ORDER — METHOCARBAMOL 500 MG PO TABS
500.0000 mg | ORAL_TABLET | Freq: Once | ORAL | Status: AC
  Administered 2024-06-15: 500 mg via ORAL
  Filled 2024-06-15: qty 1

## 2024-06-15 NOTE — ED Provider Notes (Signed)
 EKG normalized and patient is suitable for outpatient management after replacement of potassium   Claudene Rover, MD 06/15/24 [phone]
